# Patient Record
Sex: Male | Born: 1975 | Race: White | Hispanic: No | Marital: Single | Smoking: Never smoker
Health system: Southern US, Community
[De-identification: ages and names within clinical notes are randomized; demographics above are authoritative.]

---

## 2016-05-10 ENCOUNTER — Emergency Department: Payer: Self-pay

## 2016-05-10 ENCOUNTER — Encounter: Payer: Self-pay | Admitting: Emergency Medicine

## 2016-05-10 ENCOUNTER — Emergency Department
Admission: EM | Admit: 2016-05-10 | Discharge: 2016-05-10 | Disposition: A | Discharge status: Home / Self Care | Payer: Self-pay | Attending: Emergency Medicine | Admitting: Emergency Medicine

## 2016-05-10 DIAGNOSIS — R569 Unspecified convulsions: Secondary | ICD-10-CM | POA: Insufficient documentation

## 2016-05-10 LAB — BASIC METABOLIC PANEL
ANION GAP: 10 (ref 5–15)
BUN: 8 mg/dL (ref 6–20)
CALCIUM: 9.3 mg/dL (ref 8.9–10.3)
CO2: 25 mmol/L (ref 22–32)
Chloride: 102 mmol/L (ref 101–111)
Creatinine, Ser: 0.92 mg/dL (ref 0.61–1.24)
Glucose, Bld: 100 mg/dL — ABNORMAL HIGH (ref 65–99)
Potassium: 3.8 mmol/L (ref 3.5–5.1)
SODIUM: 137 mmol/L (ref 135–145)

## 2016-05-10 LAB — CBC WITH DIFFERENTIAL/PLATELET
BASOS ABS: 0 10*3/uL (ref 0–0.1)
BASOS PCT: 0 %
EOS PCT: 0 %
Eosinophils Absolute: 0 10*3/uL (ref 0–0.7)
HCT: 44.2 % (ref 40.0–52.0)
Hemoglobin: 15.5 g/dL (ref 13.0–18.0)
Lymphocytes Relative: 8 %
Lymphs Abs: 0.8 10*3/uL — ABNORMAL LOW (ref 1.0–3.6)
MCH: 32.2 pg (ref 26.0–34.0)
MCHC: 34.9 g/dL (ref 32.0–36.0)
MCV: 92.3 fL (ref 80.0–100.0)
MONO ABS: 1.4 10*3/uL — AB (ref 0.2–1.0)
Monocytes Relative: 14 %
NEUTROS ABS: 7.6 10*3/uL — AB (ref 1.4–6.5)
Neutrophils Relative %: 78 %
PLATELETS: 202 10*3/uL (ref 150–440)
RBC: 4.8 MIL/uL (ref 4.40–5.90)
RDW: 12.1 % (ref 11.5–14.5)
WBC: 9.8 10*3/uL (ref 3.8–10.6)

## 2016-05-10 NOTE — Discharge Instructions (Signed)
As we discussed it is very important that you do not drive, go up on roofs, swim, or put yourself in any situation that might be dangerous for you or others if you were to have another seizure until you are cleared by a neurologist. Please seek medical attention for any high fevers, chest pain, shortness of breath, change in behavior, persistent vomiting, bloody stool or any other new or concerning symptoms. ° °

## 2016-05-10 NOTE — ED Notes (Signed)
MD goodman in to speak with patient about discharge.

## 2016-05-10 NOTE — ED Triage Notes (Signed)
Pt arrived a&o with girlfriend.  She states pt had a seizure in the car lasting 45 seconds.  No Hx of seizure.  Reports cold sx and fever this am.

## 2016-05-10 NOTE — ED Provider Notes (Signed)
[facility] Emergency Department Provider Note    ____________________________________________   I have reviewed the triage vital signs and the nursing notes.   HISTORY  Chief Complaint Seizure  History limited by: Not Limited   HPI Dean Bauer is a 40 y.o. male who presents to the emergency department today after concern for seizure-like episode. The patient was riding in the car when his girlfriend who was driving noticed he was staring straight ahead with very pursed lips. She then states that his eyes rolled back in the to the top of his head and he jerked his head back. She did not notice any jerking or full seizure-like activity. The patient did not become incontinent of urine. Did not think that he bit his tongue. She states that he was postictal for about a minute. Patient denies any history of seizures. Has had a cold recently. At the time my evaluation he states he feels normal.   History reviewed. No pertinent past medical history.  There are no active problems to display for this patient.   No past surgical history on file.  Prior to Admission medications   Not on File    Allergies Review of patient's allergies indicates no known allergies.  No family history on file.  Social History Social History  Substance Use Topics  . Smoking status: Never Smoker  . Smokeless tobacco: Never Used  . Alcohol use Not on file    Review of Systems  Constitutional: Negative for fever. Cardiovascular: Negative for chest pain. Respiratory: Negative for shortness of breath. Gastrointestinal: Negative for abdominal pain, vomiting and diarrhea. Neurological: Possible seizure like activity 10-point ROS otherwise negative.  ____________________________________________   PHYSICAL EXAM:  VITAL SIGNS: ED Triage Vitals [05/10/16 0922]  Enc Vitals Group     BP 115/76     Pulse Rate 77     Resp 18     Temp 98.4 F (36.9 C)     Temp  Source Oral     SpO2 97 %     Weight 170 lb (77.1 kg)     Height 5\' 11"  (1.803 m)   Constitutional: Alert and oriented. Well appearing and in no distress. Eyes: Conjunctivae are normal. Normal extraocular movements. ENT   Head: Normocephalic and atraumatic.   Nose: No congestion/rhinnorhea.   Mouth/Throat: Mucous membranes are moist.   Neck: No stridor. Hematological/Lymphatic/Immunilogical: No cervical lymphadenopathy. Cardiovascular: Normal rate, regular rhythm.  No murmurs, rubs, or gallops. Respiratory: Normal respiratory effort without tachypnea nor retractions. Breath sounds are clear and equal bilaterally. No wheezes/rales/rhonchi. Gastrointestinal: Soft and nontender. No distention.  Genitourinary: Deferred Musculoskeletal: Normal range of motion in all extremities. No lower extremity edema. Neurologic:  Normal speech and language. Face symmetric. Tongue midline. Strength 5/5 in upper and lower extremities. Sensation intact to the extremities. No gross focal neurologic deficits are appreciated.  Skin:  Skin is warm, dry and intact. No rash noted. Psychiatric: Mood and affect are normal. Speech and behavior are normal. Patient exhibits appropriate insight and judgment.  ____________________________________________    LABS (pertinent positives/negatives)  Labs Reviewed  CBC WITH DIFFERENTIAL/PLATELET - Abnormal; Notable for the following:       Result Value   Neutro Abs 7.6 (*)    Lymphs Abs 0.8 (*)    Monocytes Absolute 1.4 (*)    All other components within normal limits  BASIC METABOLIC PANEL - Abnormal; Notable for the following:    Glucose, Bld 100 (*)    All other components  within normal limits     ____________________________________________   EKG  I, Phineas Semen, attending physician, personally viewed and interpreted this EKG  EKG Time: 1129 Rate: 69 Rhythm: normal sinus rhythm Axis: normal Intervals: qtc 415 QRS: narrow, q waves V1,  V2 ST changes: no st elevation Impression: abnormal ekg   ____________________________________________    RADIOLOGY  CT head   IMPRESSION:  Normal head CT   ____________________________________________   PROCEDURES  Procedures  ____________________________________________   INITIAL IMPRESSION / ASSESSMENT AND PLAN / ED COURSE  Pertinent labs & imaging results that were available during my care of the patient were reviewed by me and considered in my medical decision making (see chart for details).  Patient presented to the emergency department today after apparent seizure-like episode. None 100% convinced that the patient had a true seizure. He was not incontinent. Did not bite his tongue. Blood work and CT head here negative. This point unclear etiology of the patient's episode. However I did discuss seizure precautions with the patient and he will follow up with neurology in [facility]. ____________________________________________   FINAL CLINICAL IMPRESSION(S) / ED DIAGNOSES  Final diagnoses:  Seizure-like activity [facility])     Note: This dictation was prepared with Dragon dictation. Any transcriptional errors that result from this process are unintentional    Phineas Semen, MD 05/10/16 1138

## 2017-12-07 IMAGING — CT CT HEAD W/O CM
3 series · 15 of 47 positions shown, 18 images · non-contrast
Comparison: None.

CLINICAL DATA: Seizure this morning lasting 45 seconds.

EXAM:
CT HEAD WITHOUT CONTRAST
TECHNIQUE: Contiguous axial images were obtained from the base of the skull
through the vertex without intravenous contrast.

[Series 2: head wo · axial · 0.41mm/px · z∈[-79,+46]mm · 9 of 30 slices shown, 12 images]
[im 3/30  brain]
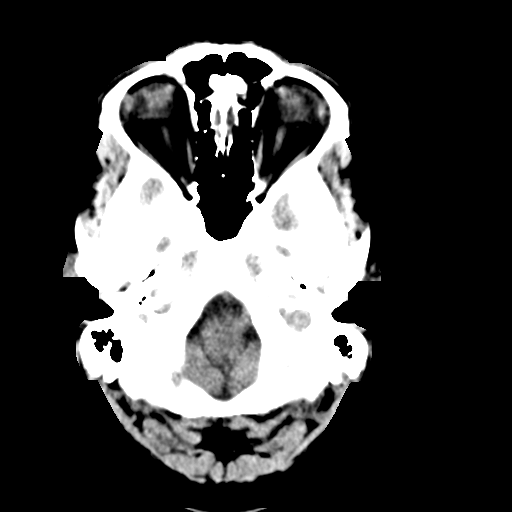
[im 3/30  bone]
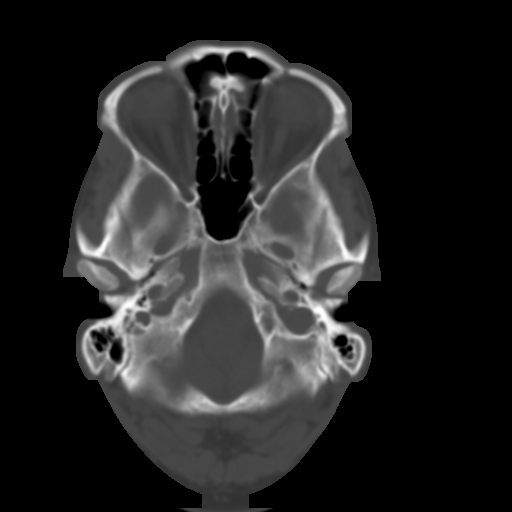
[im 6/30  brain]
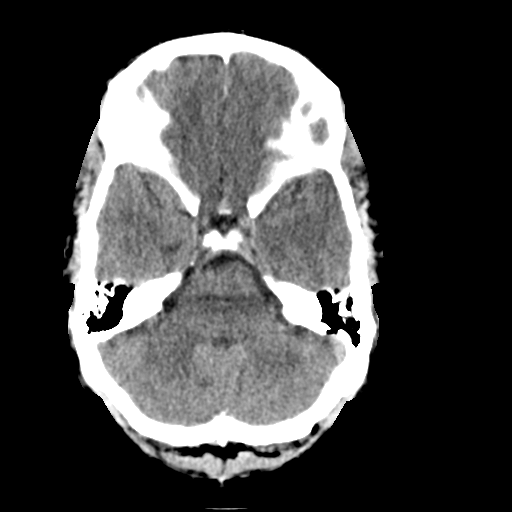
[im 9/30  brain]
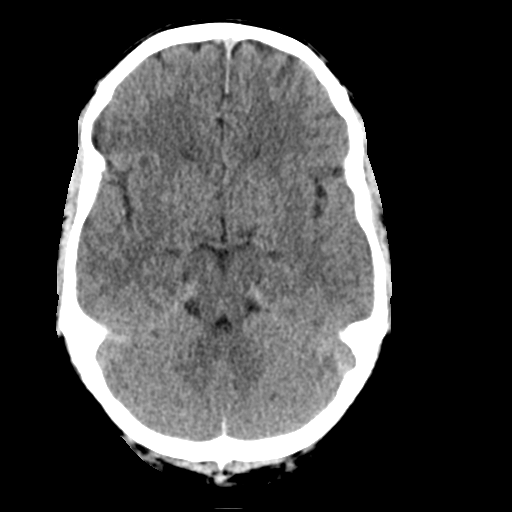
[im 12/30  brain]
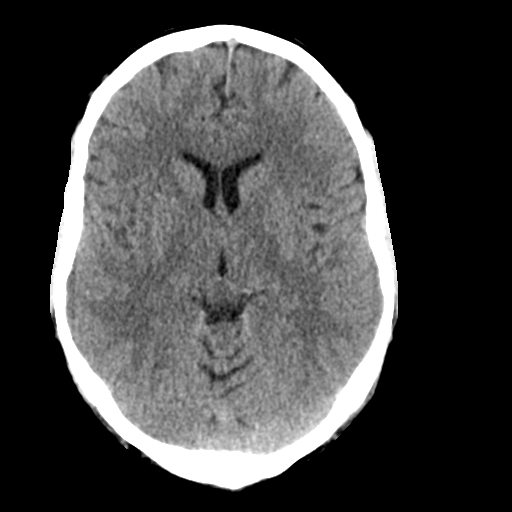
[im 16/30  brain]
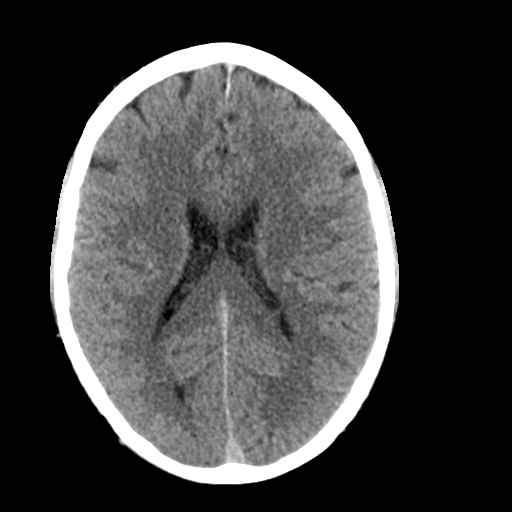
[im 16/30  bone]
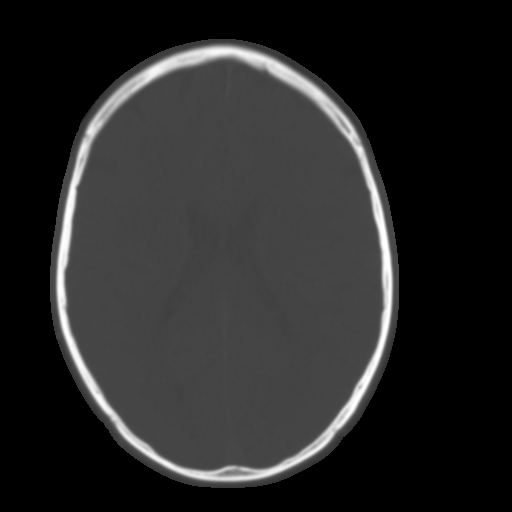
[im 19/30  brain]
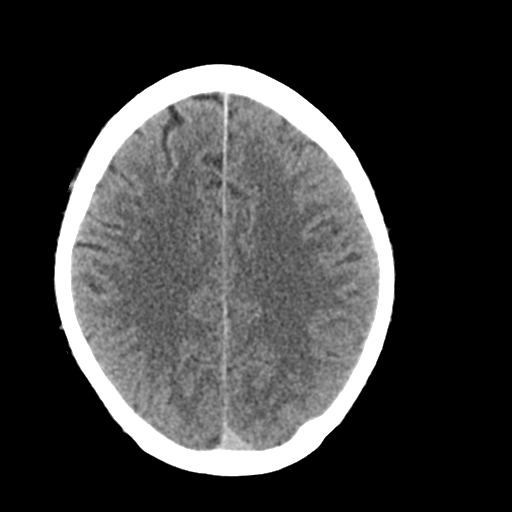
[im 22/30  brain]
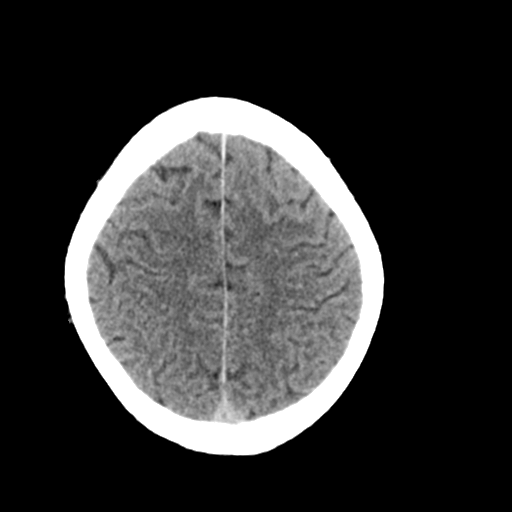
[im 25/30  brain]
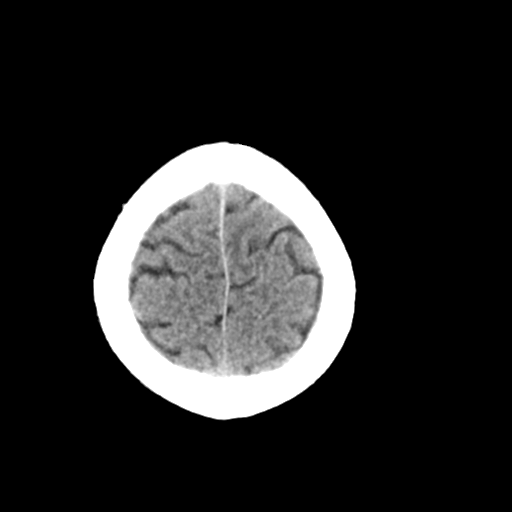
[im 28/30  brain]
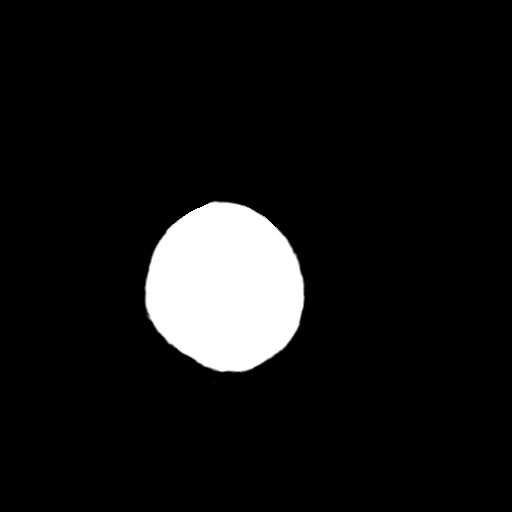
[im 28/30  bone]
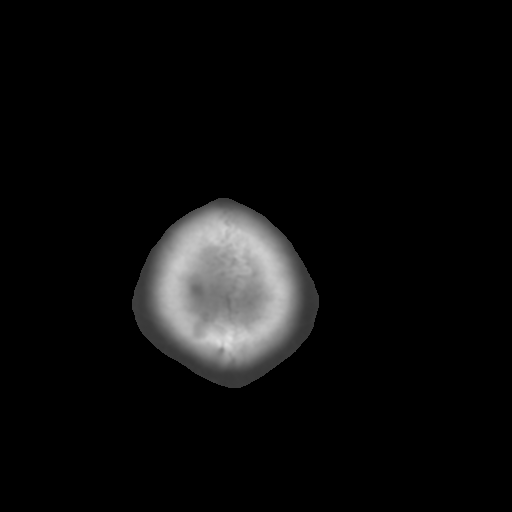

[Series 4: coronal soft tissue · coronal · 0.29mm/px · 3 of 67 slices shown]
[im 23/67  brain]
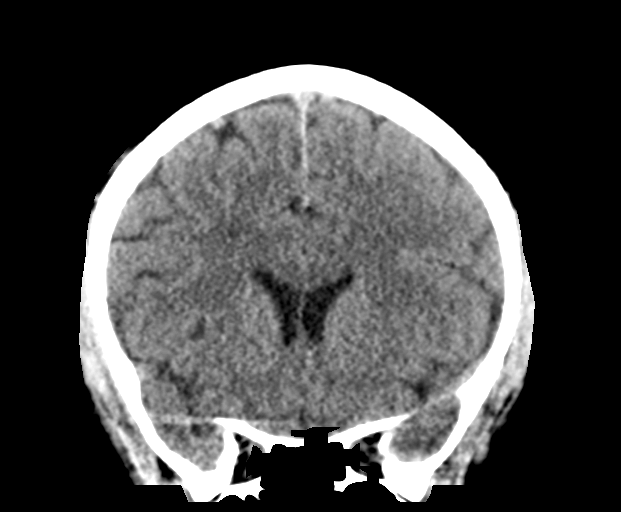
[im 30/67  brain]
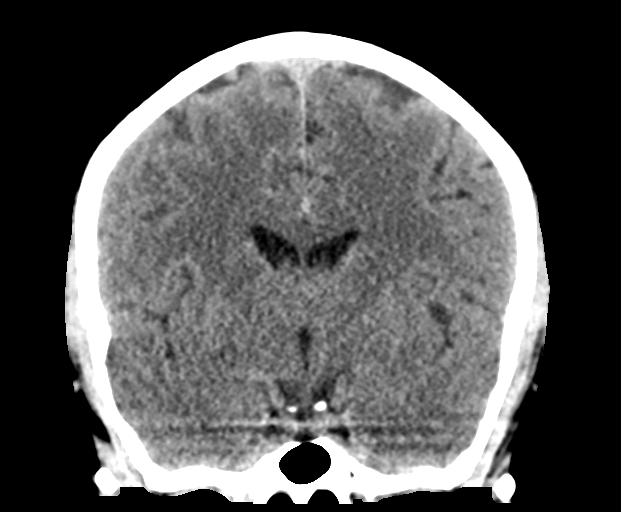
[im 37/67  brain]
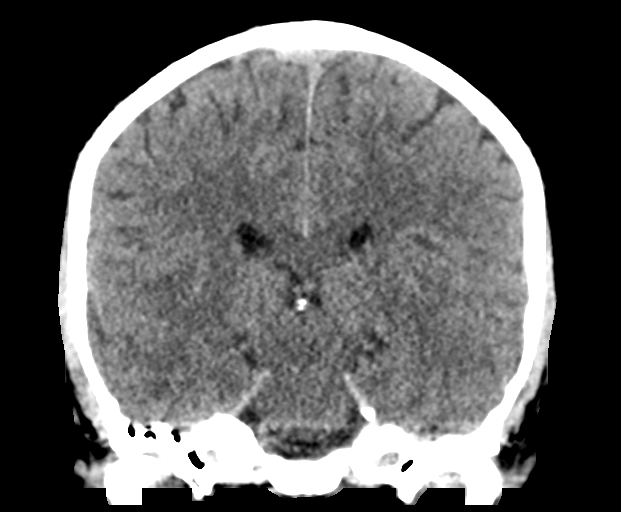

[Series 5: sagittal soft tissue · sagittal · 0.30mm/px · 3 of 53 slices shown]
[im 18/53  brain]
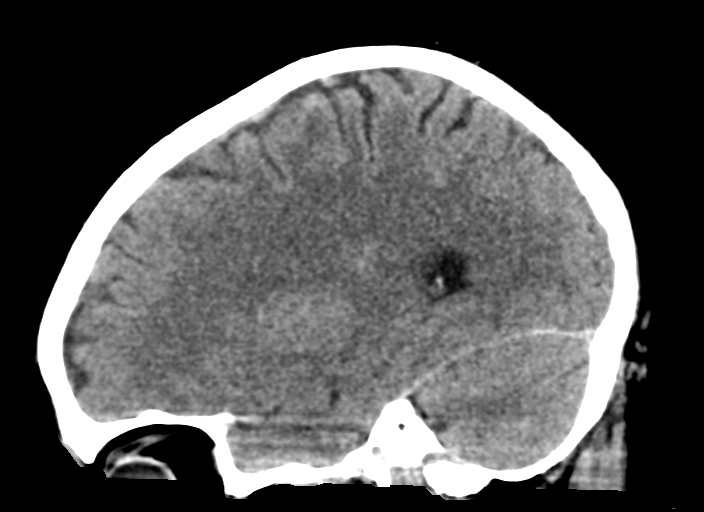
[im 27/53  brain]
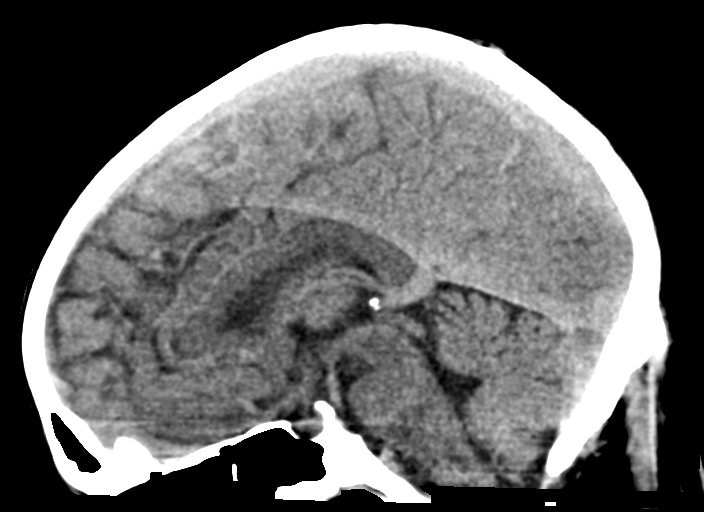
[im 35/53  brain]
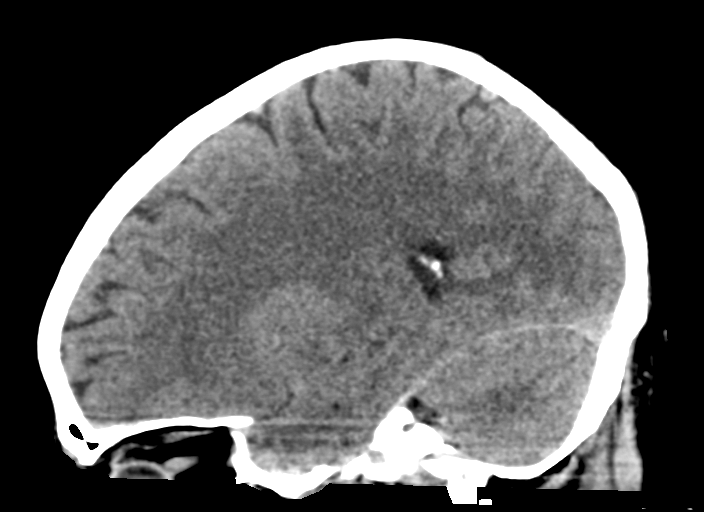

[15 of 47 positions shown; findings below may reference images not displayed]

FINDINGS: Brain: No evidence of malformation, atrophy, old or acute small or
large vessel infarction, mass lesion, hemorrhage, hydrocephalus or
extra-axial collection. No evidence of pituitary lesion.

Vascular: Normal

Skull: Normal.  No fracture or focal bone lesion.

Sinuses/Orbits: Visualized sinuses are clear. No fluid in the middle
ears or mastoids. Visualized orbits are normal.

Other: None significant
IMPRESSION: Normal head CT
